# Patient Record
Sex: Male | Born: 1995 | Race: White | Hispanic: No | Marital: Single | Smoking: Never smoker
Health system: Southern US, Community
[De-identification: ages and names within clinical notes are randomized; demographics above are authoritative.]

## PROBLEM LIST (undated history)

## (undated) DIAGNOSIS — T7840XA Allergy, unspecified, initial encounter: Secondary | ICD-10-CM

## (undated) HISTORY — DX: Allergy, unspecified, initial encounter: T78.40XA

---
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
## 2015-01-13 ENCOUNTER — Encounter: Payer: Self-pay | Admitting: Physical Therapy

## 2015-01-13 ENCOUNTER — Ambulatory Visit: Payer: BLUE CROSS/BLUE SHIELD | Attending: Family | Admitting: Physical Therapy

## 2015-01-13 DIAGNOSIS — R262 Difficulty in walking, not elsewhere classified: Secondary | ICD-10-CM | POA: Diagnosis present

## 2015-01-13 DIAGNOSIS — M6281 Muscle weakness (generalized): Secondary | ICD-10-CM | POA: Diagnosis present

## 2015-01-13 NOTE — Therapy (Signed)
[place] [facility] [address] [place], [address] Phone: [phone]   Fax:  [phone]  Physical Therapy Evaluation  Patient Details  Name: Harry Fernandez MRN: [id number] Date of Birth: 02-15-1996 Referring Provider:  Ronne Binning, NP  Encounter Date: 01/13/2015      PT End of Session - 01/13/15 1149    Visit Number 1   Number of Visits 13   Date for PT Re-Evaluation 02/25/15   PT Start Time 1100   PT Stop Time 1150   PT Time Calculation (min) 50 min   Activity Tolerance Patient tolerated treatment well;Patient limited by fatigue   Behavior During Therapy [facility] for tasks assessed/performed      Past Medical History  Diagnosis Date  . Allergy     Past Surgical History  Procedure Laterality Date  . Mpfl, "knee reconstruction" Right 10/08/14  . Knee construction revision Right 10/22/2014    There were no vitals filed for this visit.  Visit Diagnosis:  Muscle weakness (generalized) - Plan: PT plan of care cert/re-cert  Difficulty walking - Plan: PT plan of care cert/re-cert      Subjective Assessment - 01/13/15 1104    Subjective Pt reports he had R knee surgery 6/14, which was infected and he had infection requiring hospitalization 6/28. Currently pt reports he has stiffness but minimal sharp pain. Pain is mostly when he is sitting and he straightens leg.   Pertinent History Pt was playing basketball, his knee buckled and had severe pain. He was able to drive home but had significant swelling, saw his MD who diagnosed him with loose body and MPFL tear. Pt was bedbound for 1 month following his infection. He had PT which consisted of stim and ROM. He was using crutches or a cane until the start of college.   Limitations Walking;Standing   How long can you sit comfortably? with sitting in class pt has moderate pain, with 5 min.   How long can you walk comfortably? 10 min walking.   Diagnostic tests imaging  post surgery which indicates appropriate healing.   Patient Stated Goals Strengthen my knee, be able to rely on my knee, light return to sports.             [facility] PT Assessment - 01/13/15 0001    Assessment   Medical Diagnosis S/P R MPFL repair, loose body removal, microfacture   Onset Date/Surgical Date 10/08/14   Hand Dominance Right   Next MD Visit 02/25/2015   Prior Therapy yes   Precautions   Precautions None   Restrictions   Weight Bearing Restrictions No   Balance Screen   Has the patient fallen in the past 6 months No   Has the patient had a decrease in activity level because of a fear of falling?  No   Is the patient reluctant to leave their home because of a fear of falling?  No   Prior Function   Level of Independence Independent   Vocation Student   Vocation Requirements walking every day to classes 30 min daily.   Leisure tennis.   ROM / Strength   AROM / PROM / Strength AROM;PROM;Strength   Strength   Overall Strength Comments focal weakness in R quad 3/5, HS 3+/5 with reproduction of knee pain, hip abduction 4/5, hip ext. 4/5, unable to test PF due to poor knee control. Pt also demonstrages general impaired knee control, initially unable to perform single SLR, with  cuing to lcokout able to perform 3.   Palpation   Patella mobility B patellar hypomobility   Palpation comment no trigger points noted, pt has palpable atrophy of R VMO.   Transfers   Comments sit<>Stand with definite wt shift onto L LE.Unable to correct.   Ambulation/Gait   Gait Comments Decr. stance time noted on R with gait, decr. knee flex. with gait. Stairs with UE for support.         Objective: Straight leg raise multiple bouts of practice as pt demonstrated significant extensor lag. Able to perform withmanual and verbal cuing x3.  Same performed with leg externally rotated to get VMO activation, much easier for pt and able to lock out. x3  Attempted adductor leg lift but unable to  perform due to weakness and pain in knee with attempt.  glute activation/side leg lifts 3x8 with extensive cuing to point toes down and activate glutes.  Pt had difficuly with all these activities, requiring frequent re-teaching of principles and to maintain focus on quality of performance. Pt would benefit from re-teaching this at next session. PT educated pt on using a brace as he is stlil unable to maintain SLR x10, pt said he would "think about this".                  PT Education - 01/13/15 1148    Education provided Yes   Education Details educated pt on progression of therapy, to avoid walking due to poor femoral control, HEP of straight leg raise, VMO straight leg raise, side leg raise.   Person(s) Educated Patient   Methods Explanation;Tactile cues;Verbal cues   Comprehension Verbalized understanding;Returned demonstration;Tactile cues required;Need further instruction             PT Long Term Goals - 01/13/15 1153    PT LONG TERM GOAL #1   Title Pt will be able to perform 10 SLR with no cuing with no extensor lag to indicate improvement in femoral control.   Time 6   Period Weeks   Status New   PT LONG TERM GOAL #2   Title Pt will be I with HEP to improve MMT on R to 4+/5 indicating decr. compensation with functional sit<>stand   Time 6   Period Weeks   Status New   PT LONG TERM GOAL #3   Title Pt will be able to amb. x10 min with no report of buckling knee to return to normal walking at college with reduced risk for future adverse events.   Time 6   Period Weeks   Status New               Plan - 01/13/15 1150    Clinical Impression Statement Pt is a pleasant 19 y/o male seen s/p R knee "reconstruction". Pt demonstrates significant focal weakness in R quad, HS, and glute muscles. Demonstrates poor ability to peform SLR indicating impaired femoral control. Pt also reports his knee buckles routinely. ROM is also limited to 110 degr. on R vs 130 on  L in supine. Pt would benefit from skilled PT to address these deficits and return pt to more normal walking pattern with decr. pain.   Pt will benefit from skilled therapeutic intervention in order to improve on the following deficits Impaired flexibility;Improper body mechanics;Decreased range of motion;Difficulty walking;Pain;Decreased strength;Hypomobility   Rehab Potential Good   Clinical Impairments Affecting Rehab Potential student schedule, length of time immobile, significant muscle weakness, age.   PT Frequency 2x /  week   PT Duration 6 weeks   PT Treatment/Interventions Aquatic Therapy;Passive range of motion;Dry needling;Therapeutic activities;Therapeutic exercise;Functional mobility training   PT Next Visit Plan progress strengthening to upright   Consulted and Agree with Plan of Care Patient         Problem List There are no active problems to display for this patient.   Fisher,Benjamin 01/13/2015, 12:44 PM  [place] [facility] [address] [place], [address] Phone: [phone]   Fax:  [phone]

## 2015-01-15 ENCOUNTER — Ambulatory Visit: Payer: BLUE CROSS/BLUE SHIELD | Admitting: Physical Therapy

## 2015-01-15 DIAGNOSIS — M6281 Muscle weakness (generalized): Secondary | ICD-10-CM

## 2015-01-15 NOTE — Therapy (Signed)
[place] [facility] [address] [place], [address] Phone: [phone]   Fax:  [phone]  Physical Therapy Treatment  Patient Details  Name: Harry Fernandez MRN: [id number] Date of Birth: [birth year] Referring Provider:  Ronne Binning, NP  Encounter Date: 01/15/2015      PT End of Session - 01/15/15 0955    Visit Number 2   Number of Visits 13   Date for PT Re-Evaluation 02/25/15   PT Start Time 0815   PT Stop Time 0855   PT Time Calculation (min) 40 min   Activity Tolerance Patient tolerated treatment well;Patient limited by fatigue   Behavior During Therapy [facility] for tasks assessed/performed      Past Medical History  Diagnosis Date  . Allergy     Past Surgical History  Procedure Laterality Date  . Mpfl, "knee reconstruction" Right 10/08/14  . Knee construction revision Right 10/22/2014    There were no vitals filed for this visit.  Visit Diagnosis:  Muscle weakness (generalized)      Subjective Assessment - 01/15/15 0839    Subjective Pt reports he had no instances of knee buckling, no soreness except in sartorius region.   Pertinent History Pt was playing basketball, his knee buckled and had severe pain. He was able to drive home but had significant swelling, saw his MD who diagnosed him with loose body and MPFL tear. Pt was bedbound for 1 month following his infection. He had PT which consisted of stim and ROM. He was using crutches or a cane until the start of college.   How long can you sit comfortably? with sitting in class pt has moderate pain, with 5 min.   How long can you walk comfortably? 10 min walking.   Diagnostic tests imaging post surgery which indicates appropriate healing.   Patient Stated Goals Strengthen my knee, be able to rely on my knee, light return to sports.    Currently in Pain? No/denies          Objective: SLR 3x5 with 5 sec. Holds Side leg lift 3x10 Supine bridge  3x5 with 5 sec. Holds, extensive cuing for use of glutes and HS for performance, pt had difficulty with this but able to do so with practice and cuing.  Supine knee flexion stretch 5 min total.  AP tib/femur joint mobs 3x30 grade IV.  Heel raises 3x15 with both legs up, R LE down. Cuing to avoid knee flexion.                        PT Education - 01/15/15 0846    Education provided Yes   Education Details Educated on performance of supine bridge as HEP.   Person(s) Educated Patient   Methods Explanation;Tactile cues;Verbal cues   Comprehension Verbalized understanding;Returned demonstration;Verbal cues required             PT Long Term Goals - 01/13/15 1153    PT LONG TERM GOAL #1   Title Pt will be able to perform 10 SLR with no cuing with no extensor lag to indicate improvement in femoral control.   Time 6   Period Weeks   Status New   PT LONG TERM GOAL #2   Title Pt will be I with HEP to improve MMT on R to 4+/5 indicating decr. compensation with functional sit<>stand   Time 6   Period Weeks   Status New   PT LONG  TERM GOAL #3   Title Pt will be able to amb. x10 min with no report of buckling knee to return to normal walking at college with reduced risk for future adverse events.   Time 6   Period Weeks   Status New               Plan - 01/15/15 0955    Clinical Impression Statement Pt tolerates exercises well, continues to have significant muscle weakness in quad, glute, HS, and calf. Pt would benefit from continued PT to address this as well as improved knee ROM.   Pt will benefit from skilled therapeutic intervention in order to improve on the following deficits Impaired flexibility;Improper body mechanics;Decreased range of motion;Difficulty walking;Pain;Decreased strength;Hypomobility   Rehab Potential Good   Clinical Impairments Affecting Rehab Potential student schedule, length of time immobile, significant muscle weakness, age.   PT  Frequency 2x / week   PT Duration 6 weeks   PT Treatment/Interventions Aquatic Therapy;Passive range of motion;Dry needling;Therapeutic activities;Therapeutic exercise;Functional mobility training   PT Next Visit Plan progress strengthening to upright        Problem List There are no active problems to display for this patient.   Fisher,Benjamin 01/15/2015, 10:00 AM  [place] [facility] [address], [place], [address] Phone: [phone]   Fax:  [phone]

## 2015-01-17 ENCOUNTER — Encounter: Payer: Self-pay | Admitting: Physical Therapy

## 2015-01-22 ENCOUNTER — Ambulatory Visit: Payer: BLUE CROSS/BLUE SHIELD | Admitting: Physical Therapy

## 2015-01-22 DIAGNOSIS — R262 Difficulty in walking, not elsewhere classified: Secondary | ICD-10-CM

## 2015-01-22 DIAGNOSIS — M6281 Muscle weakness (generalized): Secondary | ICD-10-CM

## 2015-01-22 NOTE — Therapy (Signed)
[place] [facility] [address], [place], [address] Phone: [phone]   Fax:  [phone]  Physical Therapy Treatment  Patient Details  Name: Harry Fernandez MRN: [id number] Date of Birth: 03-25-1996 Referring Provider:  Ronne Binning, NP  Encounter Date: 01/22/2015      PT End of Session - 01/22/15 0914    Visit Number 3   Number of Visits 13   Date for PT Re-Evaluation 02/25/15   PT Start Time 0900   PT Stop Time 0945   PT Time Calculation (min) 45 min   Activity Tolerance Patient tolerated treatment well;Patient limited by fatigue   Behavior During Therapy [facility] for tasks assessed/performed      Past Medical History  Diagnosis Date  . Allergy     Past Surgical History  Procedure Laterality Date  . Mpfl, "knee reconstruction" Right 10/08/14  . Knee construction revision Right 10/22/2014    There were no vitals filed for this visit.  Visit Diagnosis:  Difficulty walking  Muscle weakness (generalized)      Subjective Assessment - 01/22/15 0903    Subjective Pt had one instance of knee buckling. He was able to catch himself and reported no incr. pain but he is having incr. stiffness and difficulty with SLR.   Pertinent History Pt was playing basketball, his knee buckled and had severe pain. He was able to drive home but had significant swelling, saw his MD who diagnosed him with loose body and MPFL tear. Pt was bedbound for 1 month following his infection. He had PT which consisted of stim and ROM. He was using crutches or a cane until the start of college.   Limitations Walking;Standing   How long can you sit comfortably? with sitting in class pt has moderate pain, with 5 min.   How long can you walk comfortably? 10 min walking.   Diagnostic tests imaging post surgery which indicates appropriate healing.   Patient Stated Goals Strengthen my knee, be able to rely on my knee, light return to sports.    Currently in Pain? No/denies                 Objective: Quad set, glute set 3x10 with 5 sec. Holds.   SLR 3x10 with 5 sec. Holds on R.  Side leg lift same parameters.  TG 3x15 squats with legs shifted to focus more work onto R LE.  Heel raises with two legs up, one leg down 3x10, pt unable to perform with good form due to signficant muscle weakness.  amb in clinic 5x50' with cuing to avoid UE compensation for poor R LE control.  Lunges on stairs 3x10 performed on R, cuing to avoid leaning forward to address muscle weakness.                  PT Education - 01/22/15 0913    Education provided Yes   Education Details Educated on glute set, quad set, corrected supine bridge.   Person(s) Educated Patient   Methods Explanation;Verbal cues   Comprehension Verbalized understanding;Returned demonstration             PT Long Term Goals - 01/13/15 1153    PT LONG TERM GOAL #1   Title Pt will be able to perform 10 SLR with no cuing with no extensor lag to indicate improvement in femoral control.   Time 6   Period Weeks   Status New   PT LONG TERM  GOAL #2   Title Pt will be I with HEP to improve MMT on R to 4+/5 indicating decr. compensation with functional sit<>stand   Time 6   Period Weeks   Status New   PT LONG TERM GOAL #3   Title Pt will be able to amb. x10 min with no report of buckling knee to return to normal walking at college with reduced risk for future adverse events.   Time 6   Period Weeks   Status New               Plan - 01/22/15 4098    Clinical Impression Statement Pt is progressing with strengthening, able to demonstrate SLR with no quad lag so able to progress to upright and stance related strengthening. Continues to demonstrate impaired gait and infrequent buckling in R knee.   Pt will benefit from skilled therapeutic intervention in order to improve on the following deficits Impaired flexibility;Improper body  mechanics;Decreased range of motion;Difficulty walking;Pain;Decreased strength;Hypomobility   Rehab Potential Good   Clinical Impairments Affecting Rehab Potential student schedule, length of time immobile, significant muscle weakness, age.   PT Frequency 2x / week   PT Duration 6 weeks   PT Treatment/Interventions Aquatic Therapy;Passive range of motion;Dry needling;Therapeutic activities;Therapeutic exercise;Functional mobility training   PT Next Visit Plan progress strengthening to upright        Problem List There are no active problems to display for this patient.   Fisher,Benjamin 01/22/2015, 10:03 AM  [facility] [address], [place], [address] Phone: [phone]   Fax:  [phone]

## 2015-01-27 ENCOUNTER — Ambulatory Visit: Payer: BLUE CROSS/BLUE SHIELD | Attending: Family | Admitting: Physical Therapy

## 2015-01-27 ENCOUNTER — Encounter: Payer: Self-pay | Admitting: Physical Therapy

## 2015-01-27 DIAGNOSIS — M6281 Muscle weakness (generalized): Secondary | ICD-10-CM | POA: Insufficient documentation

## 2015-01-27 DIAGNOSIS — R262 Difficulty in walking, not elsewhere classified: Secondary | ICD-10-CM | POA: Insufficient documentation

## 2015-01-27 NOTE — Therapy (Signed)
[facility] [address], [place], [address] Phone: [phone]   Fax:  [phone]  Physical Therapy Treatment  Patient Details  Name: Harry Fernandez MRN: [id number] Date of Birth: 1995/06/19 Referring Provider:  Ronne Binning, NP  Encounter Date: 01/27/2015      PT End of Session - 01/27/15 1034    Visit Number 4   Number of Visits 13   Date for PT Re-Evaluation 02/25/15   PT Start Time 1030   PT Stop Time 1110   PT Time Calculation (min) 40 min   Activity Tolerance Patient tolerated treatment well;Patient limited by fatigue   Behavior During Therapy [facility] for tasks assessed/performed      Past Medical History  Diagnosis Date  . Allergy     Past Surgical History  Procedure Laterality Date  . Mpfl, "knee reconstruction" Right 10/08/14  . Knee construction revision Right 10/22/2014    There were no vitals filed for this visit.  Visit Diagnosis:  Difficulty walking  Muscle weakness (generalized)      Subjective Assessment - 01/27/15 1029    Subjective Pt reports he had decr. knee buckling, has been inconsistent with HEP.   Pertinent History Pt was playing basketball, his knee buckled and had severe pain. He was able to drive home but had significant swelling, saw his MD who diagnosed him with loose body and MPFL tear. Pt was bedbound for 1 month following his infection. He had PT which consisted of stim and ROM. He was using crutches or a cane until the start of college.   Limitations Walking;Standing   How long can you sit comfortably? with sitting in class pt has moderate pain, with 5 min.   How long can you walk comfortably? 10 min walking.   Diagnostic tests imaging post surgery which indicates appropriate healing.   Patient Stated Goals Strengthen my knee, be able to rely on my knee, light return to sports.    Currently in Pain? No/denies         objective: TG 3x20 squat warmup TG 3x15  heel raises with cuing to use both LE TG 3x10 squat focusing on use of R LE.  Step ups on 6" step 1x1 min, 1x1 min with cuing to tap step above. Pt had difficulty maintianing neutral knee posture - preferred to use locking out/hyperextension but able to correct with cuing.  OMEGA leg press pyramid: 35# x20 45#x20 55#x15 85#x5 Repeated this descending only using R LE.   Very slow walking in crouched position. 10x10', same performed with lateral motion.  Pt extremely fatigued throughout session, required cuing to avoid locking out and feeling muscle use in LE                        PT Education - 01/27/15 1030    Education provided Yes   Education Details encouraged pt to be more consistent with HEP, educated on performance of exercises withinsession.   Person(s) Educated Patient   Methods Explanation;Verbal cues   Comprehension Verbalized understanding             PT Long Term Goals - 01/13/15 1153    PT LONG TERM GOAL #1   Title Pt will be able to perform 10 SLR with no cuing with no extensor lag to indicate improvement in femoral control.   Time 6   Period Weeks   Status New   PT LONG TERM GOAL #  2   Title Pt will be I with HEP to improve MMT on R to 4+/5 indicating decr. compensation with functional sit<>stand   Time 6   Period Weeks   Status New   PT LONG TERM GOAL #3   Title Pt will be able to amb. x10 min with no report of buckling knee to return to normal walking at college with reduced risk for future adverse events.   Time 6   Period Weeks   Status New               Plan - 01/27/15 1035    Clinical Impression Statement Pt is inconsistent with HEP which is limiting hsi ability to strengthen within session. PT strongly encouraged pt to continue with exercises at home. Pt continues to have definite focal weakness particularly for his size and is not fully safe for extended ambulation as evidenced by poor stance control on R.    Pt will  benefit from skilled therapeutic intervention in order to improve on the following deficits Impaired flexibility;Improper body mechanics;Decreased range of motion;Difficulty walking;Pain;Decreased strength;Hypomobility   Rehab Potential Good   Clinical Impairments Affecting Rehab Potential student schedule, length of time immobile, significant muscle weakness, age.   PT Frequency 2x / week   PT Duration 6 weeks   PT Treatment/Interventions Aquatic Therapy;Passive range of motion;Dry needling;Therapeutic activities;Therapeutic exercise;Functional mobility training   PT Next Visit Plan progress strengthening to upright        Problem List There are no active problems to display for this patient.   Fisher,Benjamin 01/27/2015, 11:48 AM  [place] [facility] [address], [place], [address] Phone: [phone]   Fax:  [phone]

## 2015-01-29 ENCOUNTER — Ambulatory Visit: Payer: BLUE CROSS/BLUE SHIELD | Admitting: Physical Therapy

## 2015-02-03 ENCOUNTER — Ambulatory Visit: Payer: BLUE CROSS/BLUE SHIELD | Admitting: Physical Therapy

## 2015-02-03 DIAGNOSIS — R262 Difficulty in walking, not elsewhere classified: Secondary | ICD-10-CM | POA: Diagnosis not present

## 2015-02-03 DIAGNOSIS — M6281 Muscle weakness (generalized): Secondary | ICD-10-CM

## 2015-02-03 NOTE — Therapy (Signed)
[facility] [address], [place], [address] Phone: [phone]   Fax:  [phone]  Physical Therapy Treatment  Patient Details  Name: Harry Fernandez MRN: [id number] Date of Birth: [birth year] Referring Provider:  Ronne Binning, NP  Encounter Date: 02/03/2015      PT End of Session - 02/03/15 1017    Visit Number 5   Number of Visits 13   Date for PT Re-Evaluation 02/25/15   PT Start Time 1010   PT Stop Time 1050   PT Time Calculation (min) 40 min   Activity Tolerance Patient tolerated treatment well;Patient limited by fatigue   Behavior During Therapy [facility] for tasks assessed/performed      Past Medical History  Diagnosis Date  . Allergy     Past Surgical History  Procedure Laterality Date  . Mpfl, "knee reconstruction" Right 10/08/14  . Knee construction revision Right 10/22/2014    There were no vitals filed for this visit.  Visit Diagnosis:  Muscle weakness (generalized)      Subjective Assessment - 02/03/15 1015    Subjective Pt reports yesterday his knee buckled and he fell into his door. Was not injured. He reports he was very sore after previous session of PT.   Pertinent History Pt was playing basketball, his knee buckled and had severe pain. He was able to drive home but had significant swelling, saw his MD who diagnosed him with loose body and MPFL tear. Pt was bedbound for 1 month following his infection. He had PT which consisted of stim and ROM. He was using crutches or a cane until the start of college.   Limitations Walking;Standing   How long can you sit comfortably? with sitting in class pt has moderate pain, with 5 min.   How long can you walk comfortably? 10 min walking.   Diagnostic tests imaging post surgery which indicates appropriate healing.   Patient Stated Goals Strengthen my knee, be able to rely on my knee, light return to sports.    Currently in Pain? No/denies                Objective:  OMEGA leg press single leg: 35#, 45#, 55# 85#, 105# with progressive reps of 25, 15, 8, 4. Pt required cuing for safety/to avoid knee hyperextension.  Slow walking 10x10' with cuing to maintain neutral posture and avoid straightening legs to maintain time under tension, pt did well with first 5 reps, difficulty with following 5.  Slow step ups on 6" step 2x10 with cuing to avoid hip flexion compensation strategy.  Supine bridge 3x10 with 5 sec. Holds.  Supine bridge marching with cuing to maintain neutral posture. 3x10 lifts on each side.  Wall sits pt unable to achieve thighs parallel to the ground due to muscle weakness. 3x20 seconds, pt unable to achieve standing without using hands to walk up the wall.  Pt demonstrated incr. Knee hyperextension with gait following workout, reported his legs "felt like jelly".                        PT Long Term Goals - 01/13/15 1153    PT LONG TERM GOAL #1   Title Pt will be able to perform 10 SLR with no cuing with no extensor lag to indicate improvement in femoral control.   Time 6   Period Weeks   Status New   PT LONG TERM GOAL #2  Title Pt will be I with HEP to improve MMT on R to 4+/5 indicating decr. compensation with functional sit<>stand   Time 6   Period Weeks   Status New   PT LONG TERM GOAL #3   Title Pt will be able to amb. x10 min with no report of buckling knee to return to normal walking at college with reduced risk for future adverse events.   Time 6   Period Weeks   Status New               Plan - 02/03/15 1019    Clinical Impression Statement Pt has gotten more consistent with HEP. In sessoin is noting improved gait. Pt is having consistent difficulty with knee buckling due to high degree of weakness. noted weakness in HS on involved side as pt was unable to perform HS curl due to muscle weakness.   Pt will benefit from skilled therapeutic intervention in order  to improve on the following deficits Impaired flexibility;Improper body mechanics;Decreased range of motion;Difficulty walking;Pain;Decreased strength;Hypomobility   Rehab Potential Good   Clinical Impairments Affecting Rehab Potential student schedule, length of time immobile, significant muscle weakness, age.   PT Frequency 2x / week   PT Duration 6 weeks   PT Treatment/Interventions Aquatic Therapy;Passive range of motion;Dry needling;Therapeutic activities;Therapeutic exercise;Functional mobility training   PT Next Visit Plan progress strengthening to upright        Problem List There are no active problems to display for this patient.   Ryna Beckstrom 02/03/2015, 11:00 AM  [facility] [address], [place], [address] Phone: [phone]   Fax:  [phone]

## 2015-02-05 ENCOUNTER — Ambulatory Visit: Payer: BLUE CROSS/BLUE SHIELD | Admitting: Physical Therapy

## 2015-02-10 ENCOUNTER — Encounter: Payer: BLUE CROSS/BLUE SHIELD | Admitting: Physical Therapy

## 2015-02-12 ENCOUNTER — Ambulatory Visit: Payer: BLUE CROSS/BLUE SHIELD | Admitting: Physical Therapy

## 2015-02-12 DIAGNOSIS — R262 Difficulty in walking, not elsewhere classified: Secondary | ICD-10-CM | POA: Diagnosis not present

## 2015-02-12 DIAGNOSIS — M6281 Muscle weakness (generalized): Secondary | ICD-10-CM

## 2015-02-12 NOTE — Therapy (Signed)
[facility] [address] [place], [place], [address] Phone: [phone]   Fax:  [phone]  Physical Therapy Treatment  Patient Details  Name: Harry Fernandez MRN: [id number] Date of Birth: November 26, 1995 No Data Recorded  Encounter Date: 02/12/2015      PT End of Session - 02/12/15 1152    Visit Number 6   Number of Visits 13   Date for PT Re-Evaluation 02/25/15   PT Start Time 1030   PT Stop Time 1115   PT Time Calculation (min) 45 min   Activity Tolerance Patient tolerated treatment well;Patient limited by fatigue   Behavior [facility] for tasks assessed/performed      Past Medical History  Diagnosis Date  . Allergy     Past Surgical History  Procedure Laterality Date  . Mpfl, "knee reconstruction" Right 10/08/14  . Knee construction revision Right 10/22/2014    There were no vitals filed for this visit.  Visit Diagnosis:  Muscle weakness (generalized)      Subjective Assessment - 02/12/15 1151    Subjective Pt reports he has been inconsistent with his HEP, would like to get back to tennis.   Pertinent History Pt was playing basketball, his knee buckled and had severe pain. He was able to drive home but had significant swelling, saw his MD who diagnosed him with loose body and MPFL tear. Pt was bedbound for 1 month following his infection. He had PT which consisted of stim and ROM. He was using crutches or a cane until the start of college.   Limitations Walking;Standing   How long can you sit comfortably? with sitting in class pt has moderate pain, with 5 min.   How long can you walk comfortably? 10 min walking.   Diagnostic tests imaging post surgery which indicates appropriate healing.   Patient Stated Goals Strengthen my knee, be able to rely on my knee, light return to sports.    Currently in Pain? No/denies                 Objective: Leg press - same parameters as previous  session.  HS standing roll up x6 min with cuing for mutual activation.  Bridge with walk out with cuing to avoid big steps to activate HS.  Toe walking focusing on gastroc activation, 3x10'  Heel raise with leg straight, legs bent, unilateral for straight leg, Bilateral for bent to strengthen soleus and gastroc.  PT issued all these as HEP and encouraged pt to be more consistent, to perform daily. Pt extremely fatigued following session.                PT Education - 02/12/15 1152    Education provided Yes   Education Details HEP    Person(s) Educated Patient   Methods Explanation   Comprehension Verbalized understanding             PT Long Term Goals - 01/13/15 1153    PT LONG TERM GOAL #1   Title Pt will be able to perform 10 SLR with no cuing with no extensor lag to indicate improvement in femoral control.   Time 6   Period Weeks   Status New   PT LONG TERM GOAL #2   Title Pt will be I with HEP to improve MMT on R to 4+/5 indicating decr. compensation with functional sit<>stand   Time 6   Period Weeks   Status New   PT LONG  TERM GOAL #3   Title Pt will be able to amb. x10 min with no report of buckling knee to return to normal walking at college with reduced risk for future adverse events.   Time 6   Period Weeks   Status New               Plan - 02/12/15 1157    Clinical Impression Statement Pt is still significantly weak, is noting decr. instance of buckling but with higher level activities such as squat and lunge pt continues to have difficulty with poor femoral control and some medial knee pain. PT encouraged pt to attempt to lightly hit a tennis ball for the next few days to assess his state, though strongly encouraged pt to avoid running or planting as he is not yet stable enough for this.   Pt will benefit from skilled therapeutic intervention in order to improve on the following deficits Impaired flexibility;Improper body  mechanics;Decreased range of motion;Difficulty walking;Pain;Decreased strength;Hypomobility   Rehab Potential Good   Clinical Impairments Affecting Rehab Potential student schedule, length of time immobile, significant muscle weakness, age.   PT Frequency 2x / week   PT Duration 6 weeks   PT Treatment/Interventions Aquatic Therapy;Passive range of motion;Dry needling;Therapeutic activities;Therapeutic exercise;Functional mobility training   PT Next Visit Plan progress strengthening to upright        Problem List There are no active problems to display for this patient.   Fisher,Benjamin 02/12/2015, 12:07 PM  [facility] [address], [place], [address] Phone: [phone]   Fax:  [phone]  Name: Harry Fernandez MRN: [id number] Date of Birth: [birth year]

## 2015-02-17 ENCOUNTER — Ambulatory Visit: Payer: BLUE CROSS/BLUE SHIELD | Admitting: Physical Therapy

## 2015-02-26 ENCOUNTER — Ambulatory Visit: Payer: BLUE CROSS/BLUE SHIELD | Attending: Family | Admitting: Physical Therapy

## 2015-02-26 DIAGNOSIS — M6281 Muscle weakness (generalized): Secondary | ICD-10-CM | POA: Insufficient documentation

## 2015-02-26 NOTE — Therapy (Signed)
[place] [facility] [address], [place], [address] Phone: [phone]   Fax:  [phone]  Physical Therapy Treatment  Patient Details  Name: Harry Fernandez MRN: [id number] Date of Birth: 30-Mar-1996 No Data Recorded  Encounter Date: 02/26/2015      PT End of Session - 02/26/15 1007    Visit Number 7   Number of Visits 13   Date for PT Re-Evaluation 03/27/15   PT Start Time 0930   PT Stop Time 1008   PT Time Calculation (min) 38 min   Activity Tolerance Patient tolerated treatment well;Patient limited by fatigue   Behavior During Therapy [facility] for tasks assessed/performed      Past Medical History  Diagnosis Date  . Allergy     Past Surgical History  Procedure Laterality Date  . Mpfl, "knee reconstruction" Right 10/08/14  . Knee construction revision Right 10/22/2014    There were no vitals filed for this visit.  Visit Diagnosis:  Muscle weakness (generalized)      Subjective Assessment - 02/26/15 1003    Subjective Pt reports he has been playing tennis, is having minimal difficulty currently with pain and buckling but continues to feel weak and tight.    Pertinent History Pt was playing basketball, his knee buckled and had severe pain. He was able to drive home but had significant swelling, saw his MD who diagnosed him with loose body and MPFL tear. Pt was bedbound for 1 month following his infection. He had PT which consisted of stim and ROM. He was using crutches or a cane until the start of college.   Limitations Walking;Standing   How long can you sit comfortably? with sitting in class pt has moderate pain, with 5 min.   How long can you walk comfortably? 10 min walking.   Diagnostic tests imaging post surgery which indicates appropriate healing.   Patient Stated Goals Strengthen my knee, be able to rely on my knee, light return to sports.    Currently in Pain? No/denies            Objective: Leg press single leg 3x10 up to 115#, 3x5 175#. Also performed on uninvolved LE which pt reported was at least twice as easy for him.  Knee extension machine 15# 3x10, unable to achieve terminal knee extension with this weight. Attempted lighter weight but unable to do so.  Attempted SAQ, still unable to achieve SAQ.  Educated pt on using L LE to elevate R, pt unable to do so so had pt perform with strap to achieve knee extension and then control lowering.  Manually assisted supine TKE performed 3x30.  Pt reported he was extremely fatigued following session.                      PT Education - 02/26/15 1007    Education provided Yes   Education Details terminal knee extension strengthening   Person(s) Educated Patient   Methods Explanation   Comprehension Verbalized understanding             PT Long Term Goals - 02/26/15 1127    PT LONG TERM GOAL #1   Title Pt will be able to perform 10 SLR with no cuing with no extensor lag to indicate improvement in femoral control.   Time 6   Period Weeks   Status Achieved   PT LONG TERM GOAL #2   Title Pt will be I with HEP to  improve MMT on R to 4+/5 indicating decr. compensation with functional sit<>stand   Time 6   Period Weeks   Status Not Met   PT LONG TERM GOAL #3   Title Pt will be able to amb. x10 min with no report of buckling knee to return to normal walking at college with reduced risk for future adverse events.   Time 6   Period Weeks   Status Partially Met               Plan - 02/26/15 1011    Clinical Impression Statement Pt has made improvement in strength, is able to do approximately 60% on his involved side compared to uninvolved. Pt has limited end range strength and is lacking active terminal knee extension. Pt also demonstrates laxity throughout the joints associated with his knee. Pt would benefit from continued PT to address these issues to allow return to  more active tennis playing (currently pt is only rallying).   Pt will benefit from skilled therapeutic intervention in order to improve on the following deficits Impaired flexibility;Improper body mechanics;Decreased range of motion;Difficulty walking;Pain;Decreased strength;Hypomobility   Rehab Potential Good   Clinical Impairments Affecting Rehab Potential student schedule, length of time immobile, significant muscle weakness, age.   PT Frequency 2x / week   PT Duration 6 weeks   PT Treatment/Interventions Aquatic Therapy;Passive range of motion;Dry needling;Therapeutic activities;Therapeutic exercise;Functional mobility training   PT Next Visit Plan progress strengthening to upright        Problem List There are no active problems to display for this patient.   Daena Alper 02/26/2015, 11:30 AM  [place] [facility] [address], [place], [address] Phone: [phone]   Fax:  [phone]  Name: Harry Fernandez MRN: [id number] Date of Birth: 01/11/1996

## 2015-03-10 ENCOUNTER — Ambulatory Visit: Payer: BLUE CROSS/BLUE SHIELD | Admitting: Physical Therapy

## 2015-03-26 ENCOUNTER — Encounter: Payer: BLUE CROSS/BLUE SHIELD | Admitting: Physical Therapy

## 2015-03-31 ENCOUNTER — Encounter: Payer: BLUE CROSS/BLUE SHIELD | Admitting: Physical Therapy

## 2015-09-14 IMAGING — CR RIGHT FOOT COMPLETE - 3+ VIEW
1 series · 3 of 3 positions shown · non-contrast
Comparison: None.

CLINICAL DATA: Fifth metatarsal pain and redness post jumping on
right foot last night

EXAM:
RIGHT FOOT COMPLETE - 3+ VIEW

[Series 1: ap · 0.17mm/px · 3 of 3 slices shown]
[im 1/3]
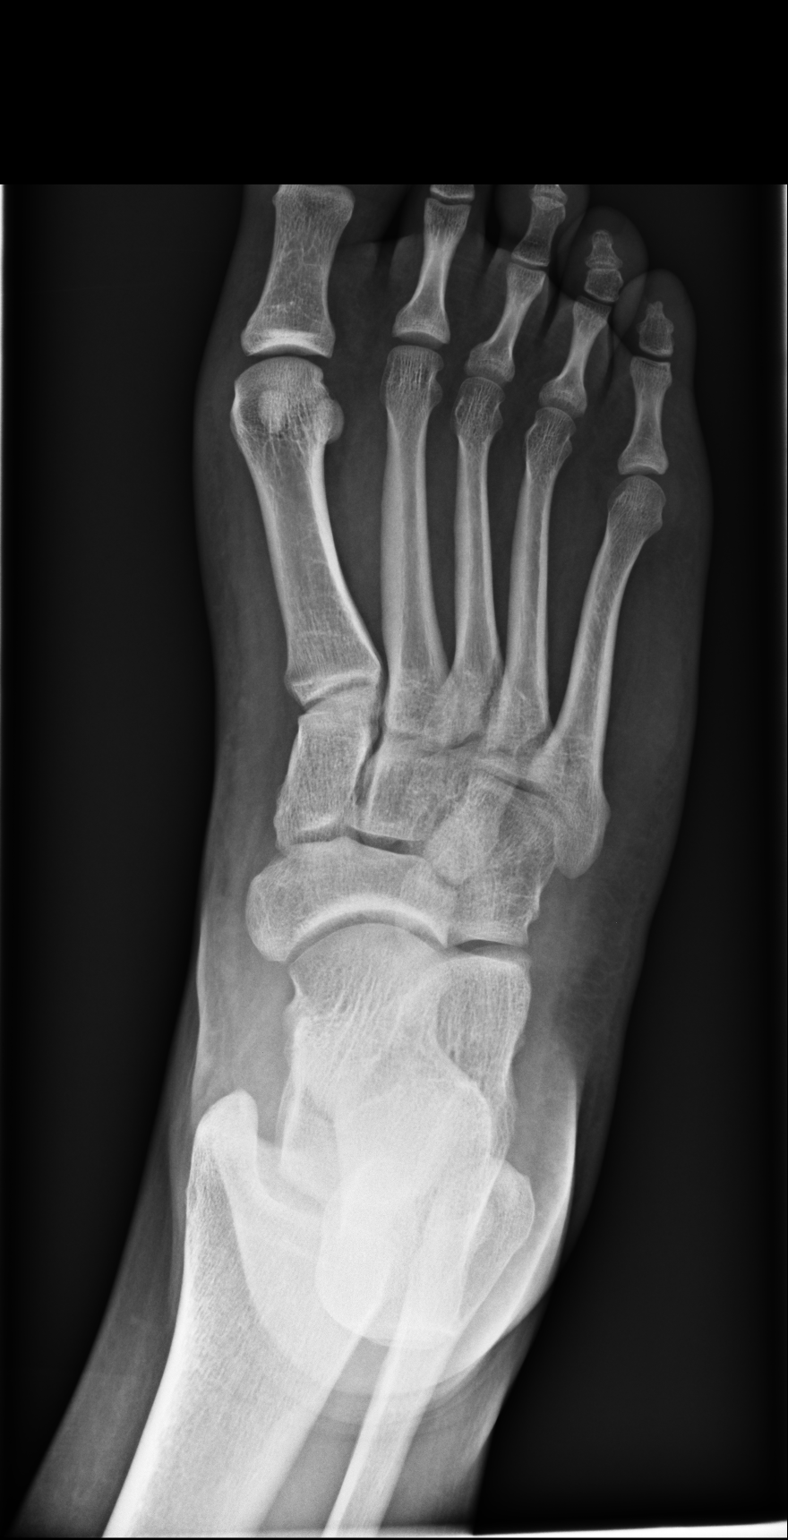
[im 2/3]
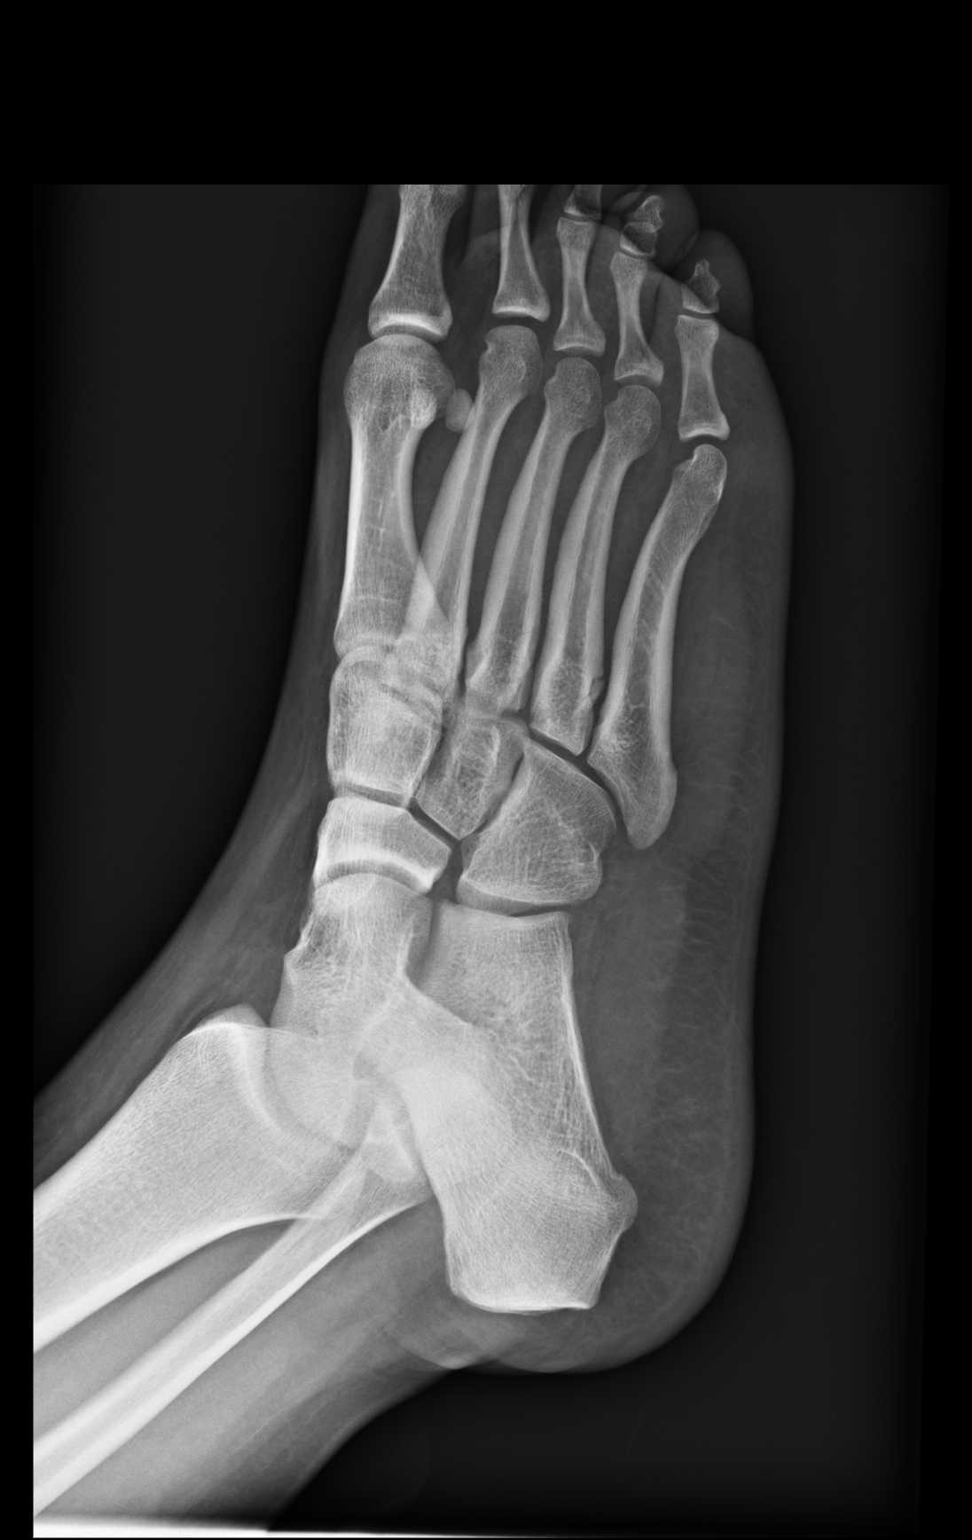
[im 3/3]
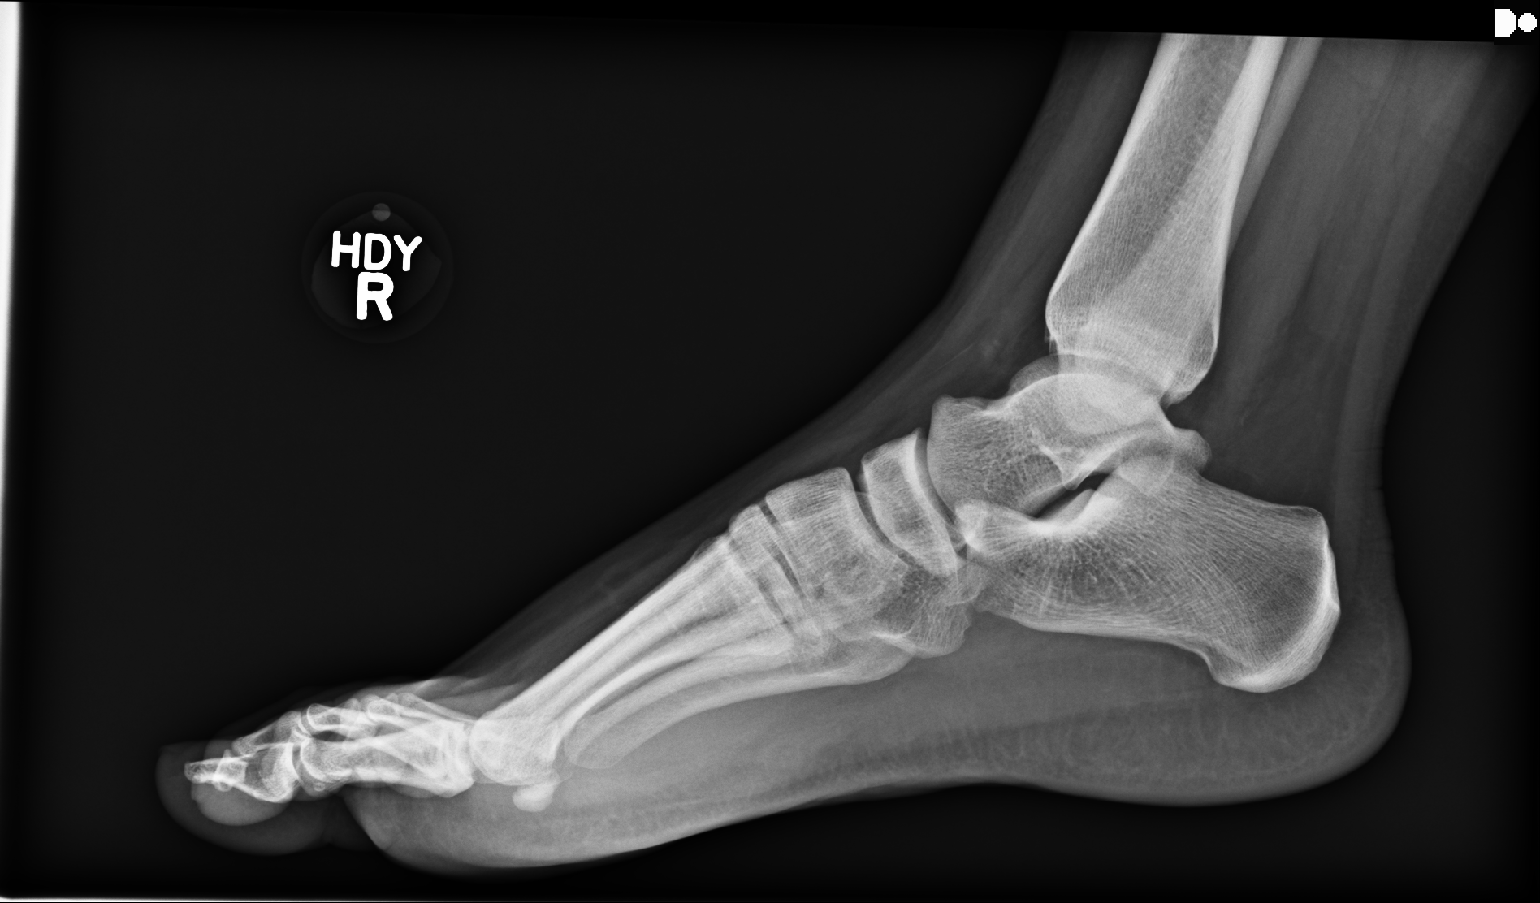

[3 of 3 positions shown; findings below may reference images not displayed]

FINDINGS: Three views of the right foot submitted. There is probable
nondisplaced fracture at the base of fourth metatarsal noted on
oblique view. Clinical correlation is necessary.
IMPRESSION: Question nondisplaced fracture at the base of fourth metatarsal
noted on oblique view. Clinical correlation is necessary.
